# Patient Record
Sex: Female | Born: 1995 | Race: Black or African American | Hispanic: No | Marital: Single | Smoking: Never smoker
Health system: Southern US, Community
[De-identification: ages and names within clinical notes are randomized; demographics above are authoritative.]

## PROBLEM LIST (undated history)

## (undated) DIAGNOSIS — I2699 Other pulmonary embolism without acute cor pulmonale: Secondary | ICD-10-CM

## (undated) DIAGNOSIS — E119 Type 2 diabetes mellitus without complications: Secondary | ICD-10-CM

## (undated) DIAGNOSIS — F419 Anxiety disorder, unspecified: Secondary | ICD-10-CM

## (undated) DIAGNOSIS — G43909 Migraine, unspecified, not intractable, without status migrainosus: Secondary | ICD-10-CM

---
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
## 2017-11-18 ENCOUNTER — Other Ambulatory Visit: Payer: Self-pay

## 2017-11-18 ENCOUNTER — Emergency Department (HOSPITAL_BASED_OUTPATIENT_CLINIC_OR_DEPARTMENT_OTHER)
Admission: EM | Admit: 2017-11-18 | Discharge: 2017-11-18 | Disposition: A | Discharge status: Home / Self Care | Payer: PRIVATE HEALTH INSURANCE | Attending: Emergency Medicine | Admitting: Emergency Medicine

## 2017-11-18 ENCOUNTER — Emergency Department (HOSPITAL_BASED_OUTPATIENT_CLINIC_OR_DEPARTMENT_OTHER): Payer: PRIVATE HEALTH INSURANCE

## 2017-11-18 ENCOUNTER — Encounter (HOSPITAL_BASED_OUTPATIENT_CLINIC_OR_DEPARTMENT_OTHER): Payer: Self-pay

## 2017-11-18 DIAGNOSIS — R7989 Other specified abnormal findings of blood chemistry: Secondary | ICD-10-CM | POA: Insufficient documentation

## 2017-11-18 DIAGNOSIS — R0789 Other chest pain: Secondary | ICD-10-CM

## 2017-11-18 DIAGNOSIS — Z79899 Other long term (current) drug therapy: Secondary | ICD-10-CM | POA: Diagnosis not present

## 2017-11-18 DIAGNOSIS — R799 Abnormal finding of blood chemistry, unspecified: Secondary | ICD-10-CM | POA: Diagnosis present

## 2017-11-18 DIAGNOSIS — J45909 Unspecified asthma, uncomplicated: Secondary | ICD-10-CM | POA: Insufficient documentation

## 2017-11-18 LAB — CBC WITH DIFFERENTIAL/PLATELET
Abs Immature Granulocytes: 0.03 10*3/uL (ref 0.00–0.07)
Basophils Absolute: 0 10*3/uL (ref 0.0–0.1)
Basophils Relative: 0 %
EOS PCT: 2 %
Eosinophils Absolute: 0.2 10*3/uL (ref 0.0–0.5)
HEMATOCRIT: 37.9 % (ref 36.0–46.0)
HEMOGLOBIN: 11.6 g/dL — AB (ref 12.0–15.0)
Immature Granulocytes: 0 %
Lymphocytes Relative: 41 %
Lymphs Abs: 3.9 10*3/uL (ref 0.7–4.0)
MCH: 24.7 pg — AB (ref 26.0–34.0)
MCHC: 30.6 g/dL (ref 30.0–36.0)
MCV: 80.6 fL (ref 80.0–100.0)
MONO ABS: 0.6 10*3/uL (ref 0.1–1.0)
Monocytes Relative: 7 %
Neutro Abs: 4.6 10*3/uL (ref 1.7–7.7)
Neutrophils Relative %: 50 %
Platelets: 400 10*3/uL (ref 150–400)
RBC: 4.7 MIL/uL (ref 3.87–5.11)
RDW: 16.1 % — ABNORMAL HIGH (ref 11.5–15.5)
WBC: 9.4 10*3/uL (ref 4.0–10.5)
nRBC: 0 % (ref 0.0–0.2)

## 2017-11-18 LAB — BASIC METABOLIC PANEL
Anion gap: 8 (ref 5–15)
BUN: 15 mg/dL (ref 6–20)
CHLORIDE: 104 mmol/L (ref 98–111)
CO2: 26 mmol/L (ref 22–32)
Calcium: 9.6 mg/dL (ref 8.9–10.3)
Creatinine, Ser: 0.66 mg/dL (ref 0.44–1.00)
GFR calc Af Amer: >60 mL/min (ref >60)
GFR calc non Af Amer: >60 mL/min (ref >60)
GLUCOSE: 103 mg/dL — AB (ref 70–99)
Potassium: 3.8 mmol/L (ref 3.5–5.1)
Sodium: 138 mmol/L (ref 135–145)

## 2017-11-18 LAB — TROPONIN I: Troponin I: <0.03 ng/mL (ref <0.03)

## 2017-11-18 LAB — HCG, QUANTITATIVE, PREGNANCY: hCG, Beta Chain, Quant, S: <1 m[IU]/mL (ref <5)

## 2017-11-18 MED ORDER — IOPAMIDOL (ISOVUE-370) INJECTION 76%
100.0000 mL | Freq: Once | INTRAVENOUS | Status: AC | PRN
  Administered 2017-11-18: 100 mL via INTRAVENOUS

## 2017-11-18 MED ORDER — FAMOTIDINE 20 MG PO TABS: 20.0000 mg | ORAL_TABLET | Freq: Every day | ORAL | 0 refills | Status: AC

## 2017-11-18 NOTE — ED Triage Notes (Signed)
Pt c/o left side chest pain for the last month with subjective SOB, went to UC and was sent here for an elevated d-dimer, no birth control, non-smoker, no recent long trips

## 2017-11-18 NOTE — ED Notes (Signed)
Presents with chest pain and shortness of breath, chest pain at left of mid sternum non-radiating, last approx . Normally occurs at end of work day, also occurred after being in the shower. Does not occur during sleep or at rest times.

## 2017-11-18 NOTE — ED Notes (Signed)
Skin warm and dry. Alert and oriented x 4.

## 2017-11-18 NOTE — Discharge Instructions (Signed)
You may trial Pepcid daily for the next 2 weeks.  If this helps, follow-up with your primary care doctor for further evaluation and refill. If symptoms do not improve, follow-up with your primary care doctor for further investigation of your symptoms. Return to the emergency room with any new, worsening, concerning symptoms.

## 2017-11-18 NOTE — ED Notes (Addendum)
NAD at this time. Pt is stable and going home.  

## 2017-11-18 NOTE — ED Notes (Signed)
PA student in to evaluate patient, pt appears comfortable, alert and oriented, family member with pt

## 2017-11-18 NOTE — ED Provider Notes (Signed)
[facility] EMERGENCY DEPARTMENT Provider Note   CSN: [id number] Arrival date & time: 11/18/17  1551     History   Chief Complaint Chief Complaint  Patient presents with  . Abnormal Lab    HPI Susan Newton is a 22 y.o. female presenting for evaluation of chest pain, shortness of breath, and elevated d-dimer.  Patient states over the past month, she has been having central/left-sided chest pain.  She reports pain is worse with exertion.  She has shortness of breath with exertion.  Patient states when she feels her symptoms, begins with nausea.  Then she developed chest pain and shortness of breath.  Then she develops a headache.  The symptoms last for 5 to 10 minutes before resolving without intervention.  She was evaluated at urgent care for this today, was found to have an elevated d-dimer and told to come to the emergency room for further evaluation. Patient states she does start a new job.  Additionally, she has family stressors and has just started being seen for weight management. She reports a history of borderline hypertension which is being monitored but not medicated.  She has a history of asthma for which she takes medication.  She denies all other medical problems including diabetes.  She denies tobacco, alcohol, or drug use. Patient denies recent fevers, chills, sore throat, cough, abdominal pain, urinary symptoms, abnormal bowel movements. she denies leg pain or swelling Patient states she recently drove to and from [place] 2 weeks ago.  This was after her symptoms began.  Otherwise, she denies trauma, immobilization, surgeries, OCP use, history of cancer, or history of previous DVT/PE. She has tried Tylenol and ibuprofen without improvement of her symptoms.  She has not tried anything else.  She is currently symptom-free.  Additional history obtained from chart review, patient has been evaluated multiple times in the past for chest pain.  Has had reassuring  work-ups in the past. ddimer mildly elevated today at 560   HPI  Past Medical History:  Diagnosis Date  . Asthma   . Migraine     Patient Active Problem List   Diagnosis Date Noted  . Morbid obesity (HCC) 10/06/2012  . Abnormal body odor 10/06/2012  . Dental caries 10/06/2012  . Acanthosis nigricans 10/06/2012  . 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 Migraines 10/06/2012  . Stress and adjustment reaction (school, home) 10/06/2012    History reviewed. No pertinent surgical history.   OB History   None      Home Medications    Prior to Admission medications   Medication Sig Start Date End Date Taking? Authorizing Provider  albuterol (PROVENTIL HFA;VENTOLIN HFA) 108 (90 BASE) MCG/ACT inhaler Inhale 2 puffs into the lungs every 6 (six) hours as needed for shortness of breath. For shortness of breath    [provider]  famotidine (PEPCID) 20 MG tablet Take 1 tablet (20 mg total) by mouth daily. 11/18/17   Ajee Heasley, PA-C  ibuprofen (ADVIL,MOTRIN) 800 MG tablet Take 800 mg by mouth every 8 (eight) hours as needed for pain.    [provider]  topiramate (TOPAMAX) 25 MG tablet As previously prescribed 03/11/12   Joelyn Oms, MD  topiramate (TOPAMAX) 25 MG tablet Take 25 mg by mouth 2 (two) times daily.    [provider]    Family History No family history on file.  Social History Social History   Tobacco Use  . Smoking status: Never Smoker  . Smokeless tobacco: Never Used  Substance Use Topics  . Alcohol use: No  . Drug use: Not on file     Allergies   Bee venom; Mushroom extract complex; and Penicillins   Review of Systems Review of Systems  Respiratory: Positive for shortness of breath.   Cardiovascular: Positive for chest pain (none currently).  Gastrointestinal: Positive for nausea.  Neurological: Positive for headaches (none currently).  All other systems reviewed and are negative.    Physical Exam Updated Vital  Signs BP 120/83 (BP Location: Right Wrist)   Pulse 81   Temp 98.2 F (36.8 C) (Oral)   Resp 20   Ht 5\' 4"  (1.626 m)   Wt (!) 167.8 kg   LMP 11/09/2017   SpO2 100%   BMI 63.51 kg/m   Physical Exam  Constitutional: She is oriented to person, place, and time. She appears well-developed and well-nourished. No distress.  Obese female in no acute distress  HENT:  Head: Normocephalic and atraumatic.  Eyes: Pupils are equal, round, and reactive to light. Conjunctivae and EOM are normal.  Neck: Normal range of motion. Neck supple.  Cardiovascular: Normal rate, regular rhythm and intact distal pulses.  Pulmonary/Chest: Effort normal and breath sounds normal. No respiratory distress. She has no wheezes.  Speaking in full sentences.  Clear lung sounds in all fields.  No respiratory distress.  Abdominal: Soft. She exhibits no distension and no mass. There is no tenderness. There is no guarding.  Musculoskeletal: Normal range of motion.  No leg pain or swelling  Neurological: She is alert and oriented to person, place, and time.  Skin: Skin is warm and dry. Capillary refill takes less than 2 seconds.  Psychiatric: She has a normal mood and affect.  Nursing note and vitals reviewed.    ED Treatments / Results  Labs (all labs ordered are listed, but only abnormal results are displayed) Labs Reviewed  CBC WITH DIFFERENTIAL/PLATELET - Abnormal; Notable for the following components:      Result Value   Hemoglobin 11.6 (*)    MCH 24.7 (*)    RDW 16.1 (*)    All other components within normal limits  BASIC METABOLIC PANEL - Abnormal; Notable for the following components:   Glucose, Bld 103 (*)    All other components within normal limits  TROPONIN I  HCG, QUANTITATIVE, PREGNANCY    EKG EKG Interpretation  Date/Time:  Tuesday November 18 2017 16:35:57 EST Ventricular Rate:  95 PR Interval:    QRS Duration: 87 QT Interval:  360 QTC Calculation: 453 R Axis:   60 Text  Interpretation:  Sinus arrhythmia No significant change since last tracing Confirmed by Melene Plan [phone]) on 11/18/2017 5:49:26 PM   Radiology Ct Angio Chest Pe W And/or Wo Contrast  Result Date: 11/18/2017 CLINICAL DATA:  Intermittent chest pain and nausea x1 month. History of asthma and vaping. EXAM: CT ANGIOGRAPHY CHEST WITH CONTRAST TECHNIQUE: Multidetector CT imaging of the chest was performed using the standard protocol during bolus administration of intravenous contrast. Multiplanar CT image reconstructions and MIPs were obtained to evaluate the vascular anatomy. CONTRAST:  Sixty-nine ISOVUE-370 IOPAMIDOL (ISOVUE-370) INJECTION 76% COMPARISON:  CXR 11/18/2017 FINDINGS: Cardiovascular: The study is of limited for the evaluation of pulmonary embolism due to body habitus and timing of contrast bolus. Preferential filling of the thoracic aorta is identified at the time of imaging. No large central pulmonary embolus is identified. Great vessels are normal in course and caliber. Normal heart size. No significant pericardial fluid/thickening. Minimal scattered left main  and three-vessel coronary arteriosclerosis. Mediastinum/Nodes: No discrete thyroid nodules. Unremarkable esophagus. No pathologically enlarged axillary, mediastinal or hilar lymph nodes. Lungs/Pleura: No pneumothorax. No pleural effusion. No acute pulmonary consolidation or dominant mass. Upper abdomen: Unremarkable. Musculoskeletal:  No aggressive appearing focal osseous lesions. Review of the MIP images confirms the above findings. IMPRESSION: 1. The study is limited for the assessment of pulmonary emboli beyond the left and right main pulmonary arteries due to patient body habitus and timing of contrast bolus. No large central pulmonary embolus is noted. 2. No active pulmonary disease. 3. Minimal left main and three-vessel coronary arteriosclerosis. Electronically Signed   By: Tollie Eth M.D.   On: 11/18/2017 18:29     Procedures Procedures (including critical care time)  Medications Ordered in ED Medications  iopamidol (ISOVUE-370) 76 % injection 100 mL (100 mLs Intravenous Contrast Given 11/18/17 1805)     Initial Impression / Assessment and Plan / ED Course  I have reviewed the triage vital signs and the nursing notes.  Pertinent labs & imaging results that were available during my care of the patient were reviewed by me and considered in my medical decision making (see chart for details).     For evaluation of chest pain, shortness of breath, and elevated d-dimer.  On discussion with the patient, symptoms appear to be group, started as nausea, developed some to chest pain and then a headache.  Symptoms resolved after 5 to 10 minutes.  Patient reports increased stress both at work and home.  Lower suspicion for ACS or PE, but considering elevated d-dimer, will offer CTA. Will obtain basic labs, troponin and EKG  Labs reassuring, no leukocytosis.  Troponin negative.  Liver and kidney function stable.  Hemoglobin stable.  CTA pending.  EKG without STEMI, unchanged from previous.  CTA negative for PE. No acute findings. discussed with pt and mom. Pt unsure if she has been having reflux sxs. As pt has associated nausea before cp and sob, will trial pepcid. Pt to f/u with pcp. At this time, pt appears safe for d/c. Return precautions given. Pt states she understands and agree to plan.   Final Clinical Impressions(s) / ED Diagnoses   Final diagnoses:  Atypical chest pain  Elevated d-dimer    ED Discharge Orders         Ordered    famotidine (PEPCID) 20 MG tablet  Daily     11/18/17 1839           Alveria Apley, PA-C 11/18/17 1848    Melene Plan, DO 11/18/17 2259

## 2017-11-18 NOTE — ED Notes (Signed)
ED Provider at bedside. 

## 2017-12-10 IMAGING — CR DG CHEST 2V
2 series · 2 of 2 positions shown · non-contrast
Comparison: 03/30/2011

CLINICAL DATA: Chest pain

EXAM:
CHEST  2 VIEW

[w chest pa *]
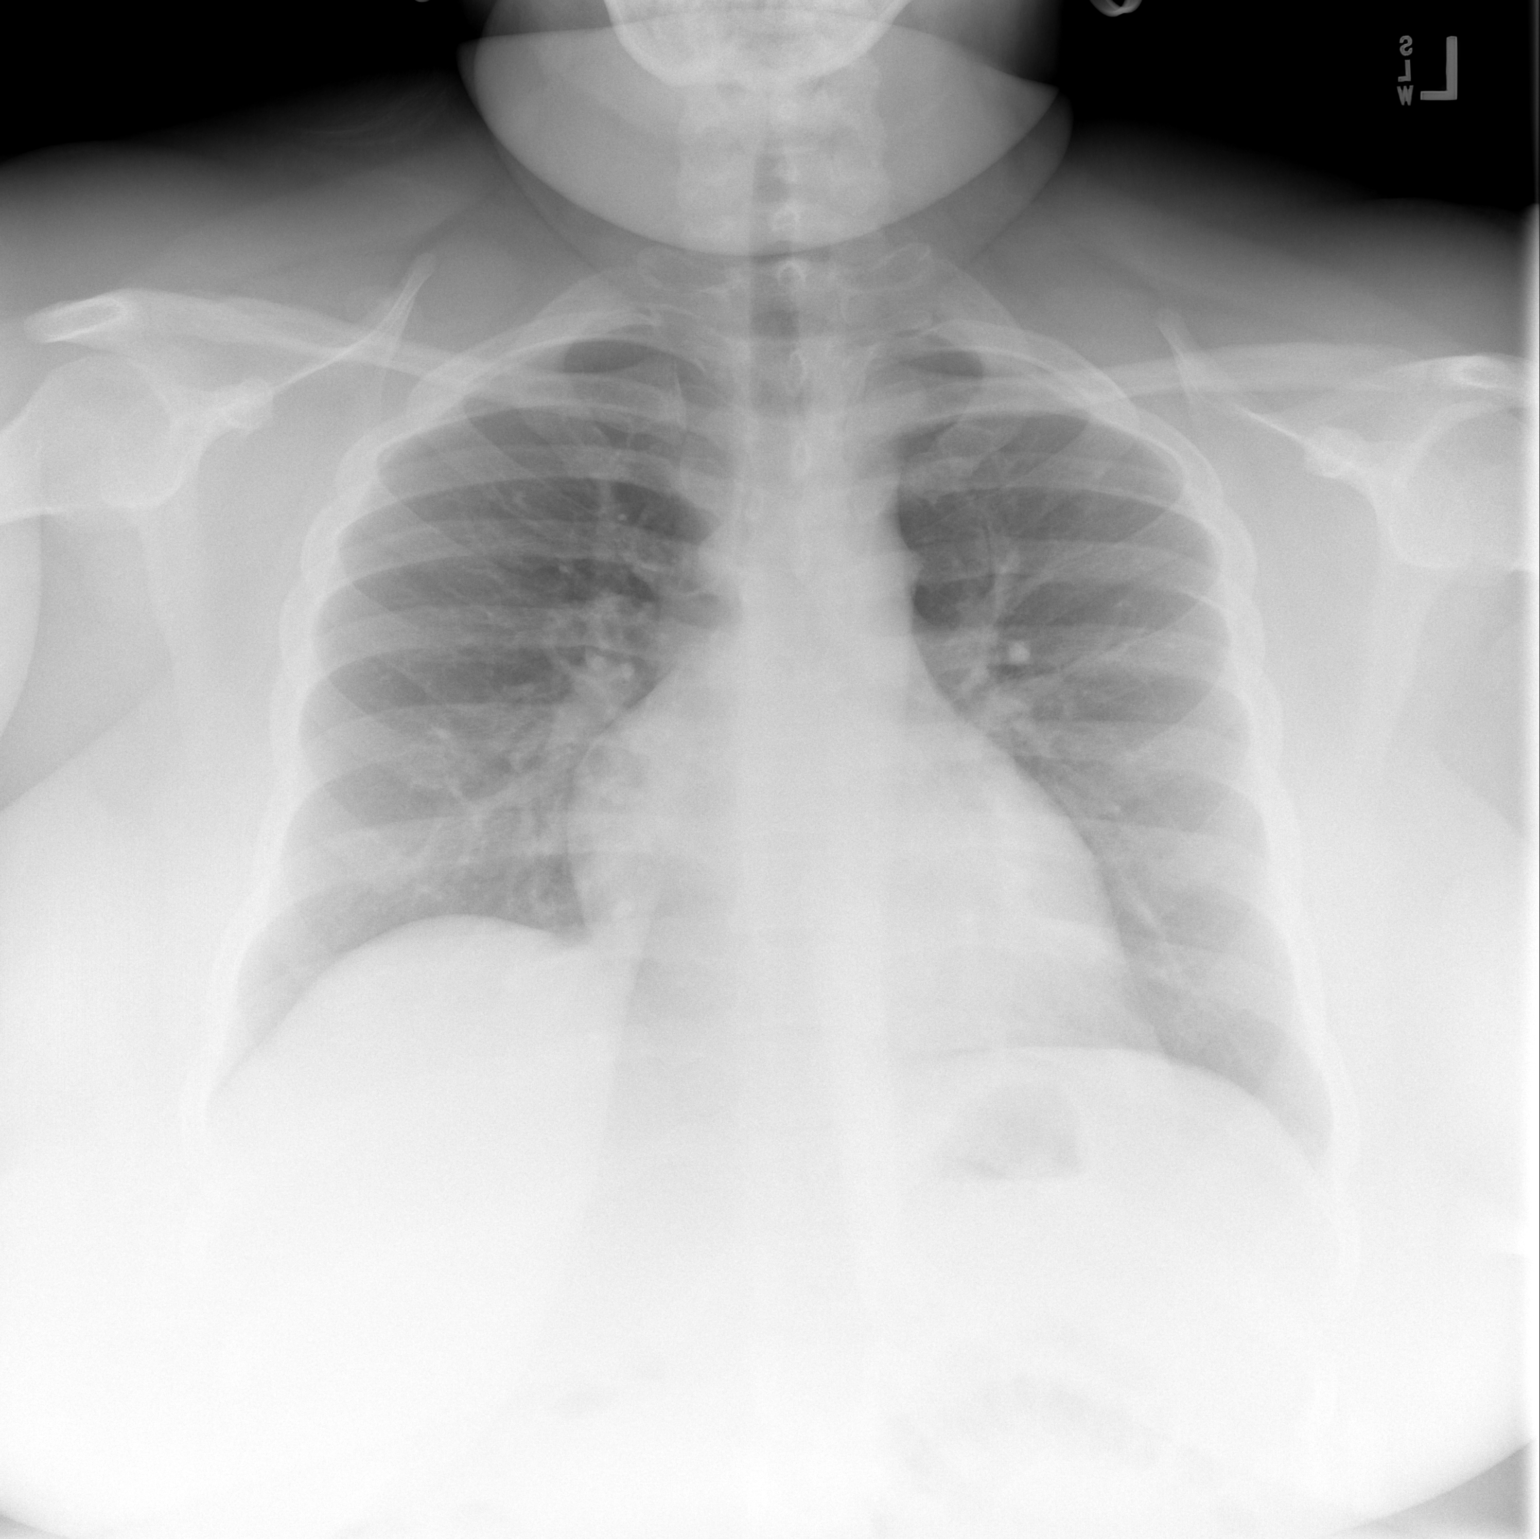

[w chest lat *]
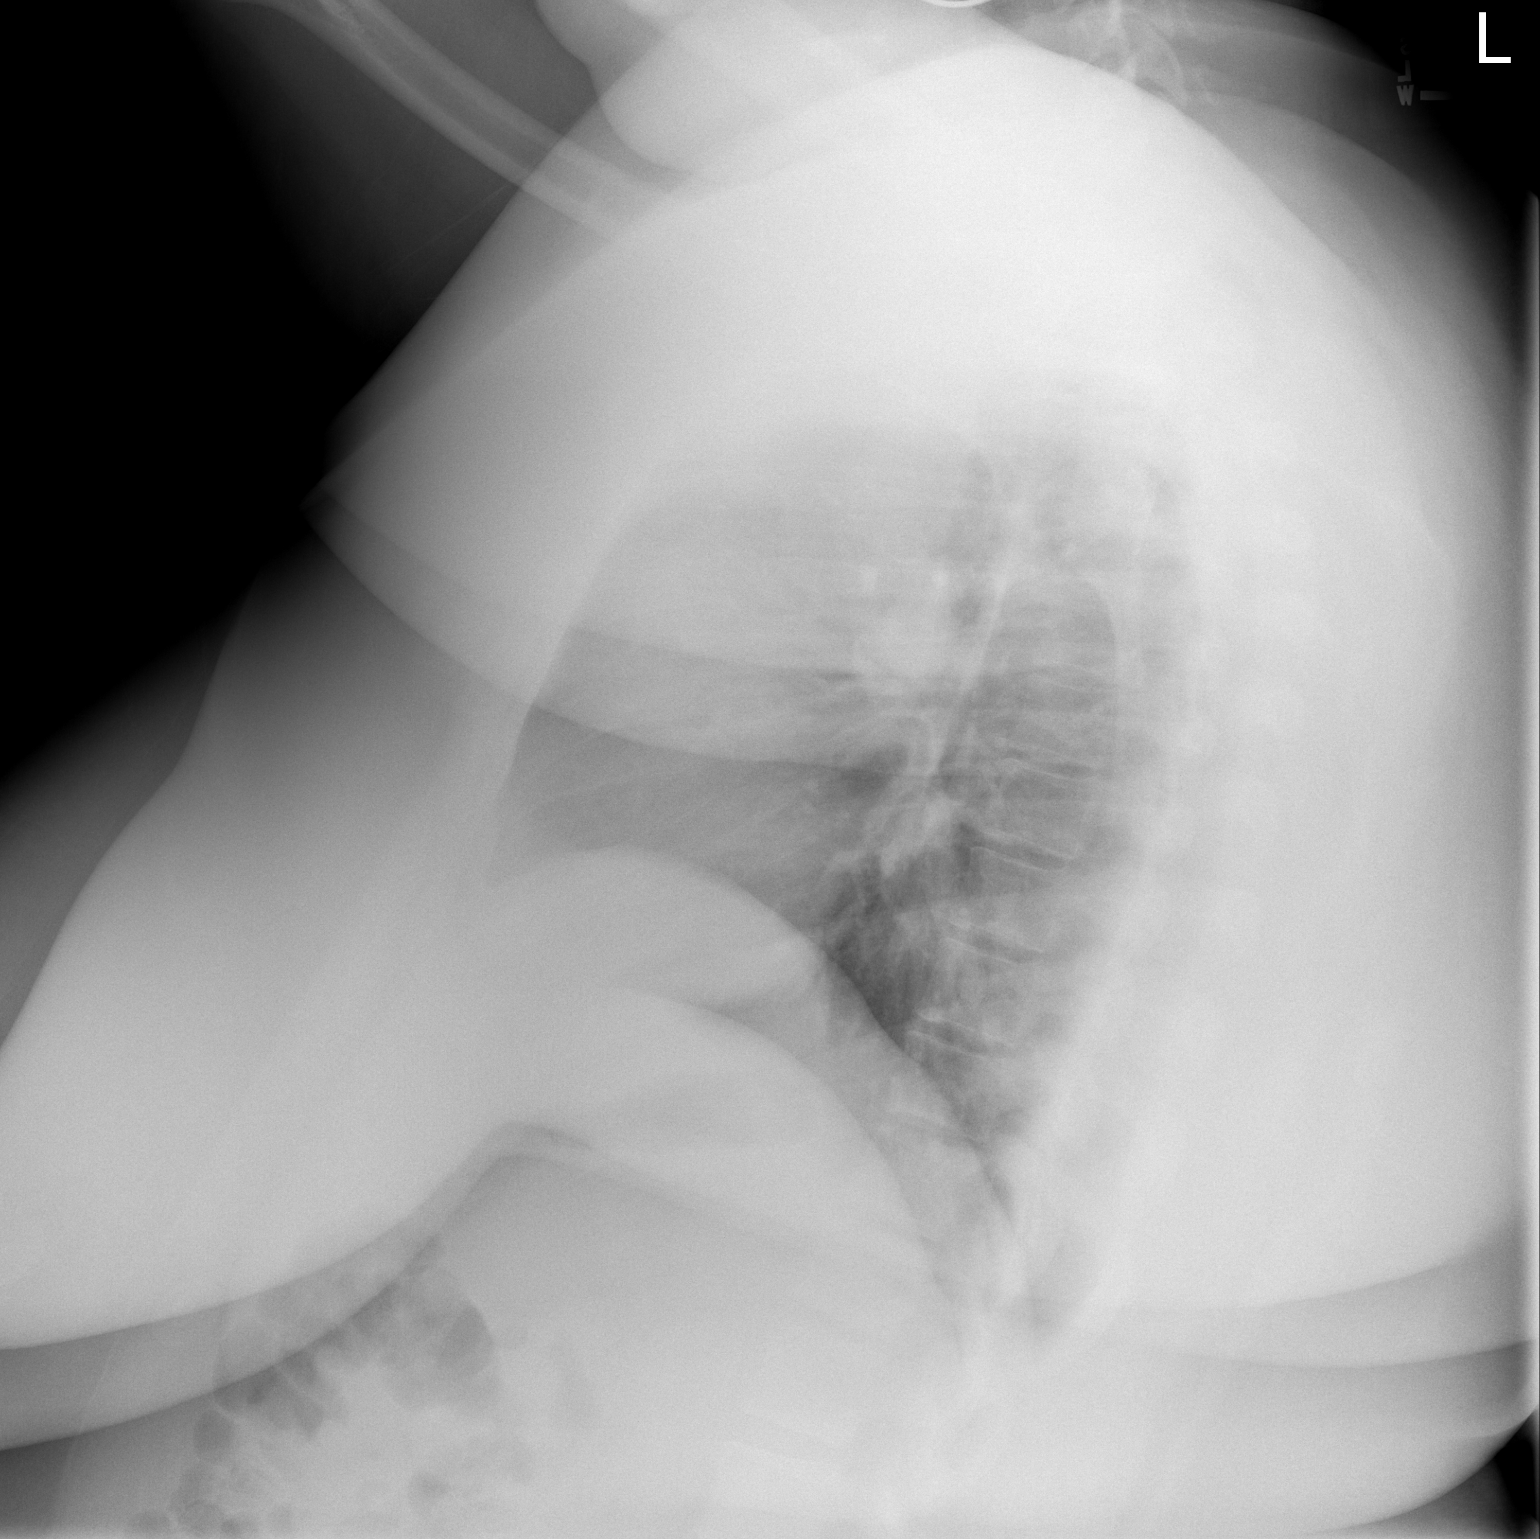

[2 of 2 positions shown; findings below may reference images not displayed]

FINDINGS: Normal heart size and aortic contours. Stable appearance of the
hila. There is no edema, consolidation, effusion, or pneumothorax.
No osseous findings.
IMPRESSION: Negative chest.

## 2019-04-15 ENCOUNTER — Other Ambulatory Visit: Payer: Self-pay

## 2019-04-15 ENCOUNTER — Emergency Department (HOSPITAL_BASED_OUTPATIENT_CLINIC_OR_DEPARTMENT_OTHER)
Admission: EM | Admit: 2019-04-15 | Discharge: 2019-04-15 | Disposition: A | Discharge status: Home / Self Care | Payer: PRIVATE HEALTH INSURANCE | Attending: Emergency Medicine | Admitting: Emergency Medicine

## 2019-04-15 ENCOUNTER — Encounter (HOSPITAL_BASED_OUTPATIENT_CLINIC_OR_DEPARTMENT_OTHER): Payer: Self-pay

## 2019-04-15 ENCOUNTER — Emergency Department (HOSPITAL_BASED_OUTPATIENT_CLINIC_OR_DEPARTMENT_OTHER): Payer: PRIVATE HEALTH INSURANCE

## 2019-04-15 DIAGNOSIS — Z880 Allergy status to penicillin: Secondary | ICD-10-CM | POA: Insufficient documentation

## 2019-04-15 DIAGNOSIS — Z91030 Bee allergy status: Secondary | ICD-10-CM | POA: Insufficient documentation

## 2019-04-15 DIAGNOSIS — R0789 Other chest pain: Secondary | ICD-10-CM | POA: Insufficient documentation

## 2019-04-15 DIAGNOSIS — Z91018 Allergy to other foods: Secondary | ICD-10-CM | POA: Insufficient documentation

## 2019-04-15 DIAGNOSIS — Z79899 Other long term (current) drug therapy: Secondary | ICD-10-CM | POA: Insufficient documentation

## 2019-04-15 DIAGNOSIS — J45909 Unspecified asthma, uncomplicated: Secondary | ICD-10-CM | POA: Diagnosis not present

## 2019-04-15 DIAGNOSIS — R079 Chest pain, unspecified: Secondary | ICD-10-CM

## 2019-04-15 HISTORY — DX: Other pulmonary embolism without acute cor pulmonale: I26.99

## 2019-04-15 LAB — CBC
HCT: 36.1 % (ref 36.0–46.0)
Hemoglobin: 10.6 g/dL — ABNORMAL LOW (ref 12.0–15.0)
MCH: 22 pg — ABNORMAL LOW (ref 26.0–34.0)
MCHC: 29.4 g/dL — ABNORMAL LOW (ref 30.0–36.0)
MCV: 75.1 fL — ABNORMAL LOW (ref 80.0–100.0)
Platelets: 488 10*3/uL — ABNORMAL HIGH (ref 150–400)
RBC: 4.81 MIL/uL (ref 3.87–5.11)
RDW: 19.8 % — ABNORMAL HIGH (ref 11.5–15.5)
WBC: 8.2 10*3/uL (ref 4.0–10.5)
nRBC: 0 % (ref 0.0–0.2)

## 2019-04-15 LAB — BASIC METABOLIC PANEL
Anion gap: 9 (ref 5–15)
BUN: 11 mg/dL (ref 6–20)
CO2: 26 mmol/L (ref 22–32)
Calcium: 9.1 mg/dL (ref 8.9–10.3)
Chloride: 102 mmol/L (ref 98–111)
Creatinine, Ser: 0.62 mg/dL (ref 0.44–1.00)
GFR calc Af Amer: >60 mL/min (ref >60)
GFR calc non Af Amer: >60 mL/min (ref >60)
Glucose, Bld: 106 mg/dL — ABNORMAL HIGH (ref 70–99)
Potassium: 4 mmol/L (ref 3.5–5.1)
Sodium: 137 mmol/L (ref 135–145)

## 2019-04-15 LAB — PREGNANCY, URINE: Preg Test, Ur: NEGATIVE

## 2019-04-15 LAB — TROPONIN I (HIGH SENSITIVITY)
Troponin I (High Sensitivity): 3 ng/L (ref <18)
Troponin I (High Sensitivity): 2 ng/L (ref <18)

## 2019-04-15 MED ORDER — ALUM & MAG HYDROXIDE-SIMETH 200-200-20 MG/5ML PO SUSP
30.0000 mL | Freq: Once | ORAL | Status: AC
  Administered 2019-04-15: 16:00:00 30 mL via ORAL
  Filled 2019-04-15: qty 30

## 2019-04-15 MED ORDER — HYDROXYZINE HCL 25 MG PO TABS
25.0000 mg | ORAL_TABLET | Freq: Once | ORAL | Status: AC
  Administered 2019-04-15: 25 mg via ORAL
  Filled 2019-04-15: qty 1

## 2019-04-15 NOTE — Discharge Instructions (Addendum)
Please follow-up with your primary care provider regarding today's encounter.  I highly recommend that you speak with your PCP about your anxiety.  I encourage you to discuss incorporating an SSRI/SNRI and/or therapy.  You also will need to have your sleep study completed.  You would likely benefit from CPAP.  Take Maalox or other abortive antacids for symptoms of GERD.  If you experience episodes of GERD more than twice a week you should discuss with your PCP proton pump inhibitors or H2 blockers.  Please return to the ED or seek immediate medical attention should you experience any new or worsening symptoms.

## 2019-04-15 NOTE — ED Notes (Signed)
Pt reports increase in stress and anxiety over the past few days. States chest pain is worse when she feels anxious.

## 2019-04-15 NOTE — ED Triage Notes (Addendum)
Pt c/o CP started yesterday-states she had hx of "blood clot" ~1-1.5 yrs ago that she thinks was in her lung but unsure left or right-denies fever/cough/flu sx-NAD-to triage in w/c-able to stand for weight w/o difficulty

## 2019-04-15 NOTE — ED Provider Notes (Signed)
[facility] EMERGENCY DEPARTMENT Provider Note   CSN: [id number] Arrival date & time: 04/15/19  1402     History Chief Complaint  Patient presents with  . Chest Pain    Susan Newton is a 24 y.o. female with PMH significant for anxiety, obesity, and OSA who presents to the ED with complaints of chest pain.  Patient reports that she stopped at her boyfriend's house last night and was not able to obtain good sleep as she was lying completely flat.  She endorses orthopnea and significant snoring so she sleeps with an incline at her home.  This morning, she felt as though she was mildly short of breath with some burning substernal chest discomfort.  She then proceeded to get into an argument with her boyfriend and shortly after leaving and getting to her personal vehicle, she developed significant chest squeezing discomfort, shortness of breath symptoms, and feeling as though she may pass out.  She called her father and then felt as though things "went black".  She woke up to her father, Susan Newton, performing a sternal rub.  Susan Newton informs me that she often will experience episodes of chest discomfort and that there is usually an anxiety component, however he was concerned given her being difficult to arouse for an extended period of time.  Patient denies any recent illness, fevers or chills, headache or dizziness, cough, back or abdominal pain, nausea or vomiting, leg swelling, clotting disorder, recent surgery or immobilization, chance of pregnancy, urinary symptoms, or changes in bowel habits.  She had been prescribed an SNRI by her PCP for her symptoms of anxiety, but and has been out of it for awhile.    HPI     Past Medical History:  Diagnosis Date  . Asthma   . Migraine   . Pulmonary emboli [facility])     Patient Active Problem List   Diagnosis Date Noted  . Morbid obesity (HCC) 10/06/2012  . Abnormal body odor 10/06/2012  . Dental caries 10/06/2012  . Acanthosis nigricans  10/06/2012  . 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 Migraines 10/06/2012  . Stress and adjustment reaction (school, home) 10/06/2012    History reviewed. No pertinent surgical history.   OB History   No obstetric history on file.     No family history on file.  Social History   Tobacco Use  . Smoking status: Never Smoker  . Smokeless tobacco: Never Used  Substance Use Topics  . Alcohol use: Yes    Comment: occ  . Drug use: Never    Home Medications Prior to Admission medications   Medication Sig Start Date End Date Taking? Authorizing Provider  albuterol (PROVENTIL HFA;VENTOLIN HFA) 108 (90 BASE) MCG/ACT inhaler Inhale 2 puffs into the lungs every 6 (six) hours as needed for shortness of breath. For shortness of breath    [provider]  famotidine (PEPCID) 20 MG tablet Take 1 tablet (20 mg total) by mouth daily. 11/18/17   Caccavale, Sophia, PA-C  ibuprofen (ADVIL,MOTRIN) 800 MG tablet Take 800 mg by mouth every 8 (eight) hours as needed for pain.    [provider]  topiramate (TOPAMAX) 25 MG tablet As previously prescribed 03/11/12   Joelyn Oms, MD  topiramate (TOPAMAX) 25 MG tablet Take 25 mg by mouth 2 (two) times daily.    [provider]    Allergies    Bee venom, Mushroom extract complex, and Penicillins  Review of Systems   Review of Systems  Constitutional: Negative for  fever.  Respiratory: Positive for shortness of breath. Negative for cough and wheezing.   Cardiovascular: Positive for chest pain.  Gastrointestinal: Negative for abdominal pain.  Musculoskeletal: Negative for back pain.  Psychiatric/Behavioral: The patient is nervous/anxious.     Physical Exam Updated Vital Signs BP (!) 149/85   Pulse (!) 103   Temp 98.3 F (36.8 C) (Oral)   Resp (!) 22   Ht 5\' 4"  (1.626 m)   Wt (!) 180.5 kg   LMP 04/07/2019   SpO2 99%   BMI 68.32 kg/m   Physical Exam Vitals and nursing note reviewed. Exam conducted with a chaperone  present.  Constitutional:      Appearance: She is obese.     Comments: Teary-eyed.  Anxious and upset.  HENT:     Head: Normocephalic and atraumatic.  Eyes:     General: No scleral icterus.    Conjunctiva/sclera: Conjunctivae normal.  Cardiovascular:     Rate and Rhythm: Normal rate and regular rhythm.     Pulses: Normal pulses.     Heart sounds: Normal heart sounds.  Pulmonary:     Effort: Pulmonary effort is normal. No respiratory distress.     Breath sounds: Normal breath sounds. No wheezing or rales.  Musculoskeletal:        General: Normal range of motion.     Cervical back: Normal range of motion.     Right lower leg: No edema.     Left lower leg: No edema.  Skin:    General: Skin is dry.     Capillary Refill: Capillary refill takes less than 2 seconds.  Neurological:     Mental Status: She is alert and oriented to person, place, and time.     GCS: GCS eye subscore is 4. GCS verbal subscore is 5. GCS motor subscore is 6.  Psychiatric:        Mood and Affect: Mood normal.        Behavior: Behavior normal.        Thought Content: Thought content normal.      ED Results / Procedures / Treatments   Labs (all labs ordered are listed, but only abnormal results are displayed) Labs Reviewed  BASIC METABOLIC PANEL - Abnormal; Notable for the following components:      Result Value   Glucose, Bld 106 (*)    All other components within normal limits  CBC - Abnormal; Notable for the following components:   Hemoglobin 10.6 (*)    MCV 75.1 (*)    MCH 22.0 (*)    MCHC 29.4 (*)    RDW 19.8 (*)    Platelets 488 (*)    All other components within normal limits  PREGNANCY, URINE  TROPONIN I (HIGH SENSITIVITY)  TROPONIN I (HIGH SENSITIVITY)    EKG EKG Interpretation  Date/Time:  Thursday April 15 2019 14:07:13 EDT Ventricular Rate:  112 PR Interval:  126 QRS Duration: 80 QT Interval:  352 QTC Calculation: 480 R Axis:   36 Text Interpretation: Sinus tachycardia  Nonspecific T wave abnormality No significant change since last tracing Confirmed by [id number] (Gwyneth Sprout) on 04/15/2019 2:40:37 PM   Radiology DG Chest 2 View  Result Date: 04/15/2019 CLINICAL DATA:  24 year old female with chest pain. EXAM: CHEST - 2 VIEW COMPARISON:  Chest radiograph dated 11/18/2017. FINDINGS: The heart size and mediastinal contours are within normal limits. Both lungs are clear. The visualized skeletal structures are unremarkable. IMPRESSION: No active cardiopulmonary disease. Electronically Signed  By: Anner Crete M.D.   On: 04/15/2019 15:19    Procedures Procedures (including critical care time)  Medications Ordered in ED Medications  alum & mag hydroxide-simeth (MAALOX/MYLANTA) 200-200-20 MG/5ML suspension 30 mL (30 mLs Oral Given 04/15/19 1539)  hydrOXYzine (ATARAX/VISTARIL) tablet 25 mg (25 mg Oral Given 04/15/19 1539)    ED Course  I have reviewed the triage vital signs and the nursing notes.  Pertinent labs & imaging results that were available during my care of the patient were reviewed by me and considered in my medical decision making (see chart for details).    MDM Rules/Calculators/A&P                      Patient's history and physical exam is consistent with nonspecific chest pain.  I personally reviewed patient's medical record and the pulmonary embolism that she said in her past medical history was inaccurate.  The CTA obtained in 2019 did not demonstrate any evidence of PE.  She denies any other history of clots or clotting disorder.  While she is endorsing a sudden onset chest pain and was mildly tachycardic while in triage, I have low suspicion for pulmonary embolism.  Patient denies any hormone use, history of clots, findings, recent surgery or immobilization, or other risk factors.  If not for her tachycardia, she would be PERC negative.  She is accompanied by her father who denies any family history of premature cardiac disease.  However,  patient would certainly benefit from sleep study given her snoring and concern for OSA.  She has a BMI of 68.32 and has been evaluated for weight management.  Given that she was lying flat last night, there is a possibility that she was hypercarbic which contributed to her feeling fatigued and short of breath this morning.  However, on my examination she is not demonstrating any increased work of breathing.  She instead feels that she has to be evaluated here in the ER for her chest pain symptoms.  I assured her that it is always in her best interest to come here for evaluation to ensure that there is no acute or emergent cardiopulmonary disease, particularly given that she was difficult to arouse.  However, given her past medical history of anxiety in conjunction with her report that her symptoms worsened after having a verbal argument with her boyfriend, suspect that that is again playing at home and her presentation here to the ED today.  Her hemoglobin was mildly low at 10.6 compared to labs obtained 1 year ago 11.6, but I have low suspicion for demand ischemia causing her chest pain today.  The remainder of her laboratory work-up is entirely unremarkable.  Plain films obtained demonstrate no acute cardiopulmonary findings.  I discussed with Dr. Maryan Rued who agrees with assessment and plan.   On reexamination, patient is feeling improved.  Strict ED return precautions discussed.  All of the evaluation and work-up results were discussed with the patient and any family at bedside. They were provided opportunity to ask any additional questions and have none at this time. They have expressed understanding of verbal discharge instructions as well as return precautions and are agreeable to the plan.    Final Clinical Impression(s) / ED Diagnoses Final diagnoses:  Nonspecific chest pain    Rx / DC Orders ED Discharge Orders    None       Corena Herter, PA-C 04/15/19 1808    Blanchie Dessert,  MD 04/16/19 [phone]

## 2021-08-16 IMAGING — DX DG CHEST 2V
2 series · 2 of 2 positions shown · non-contrast
Comparison: Chest radiograph dated 11/18/2017.

CLINICAL DATA: 24-year-old female with chest pain.

EXAM:
CHEST - 2 VIEW

[chest pa]
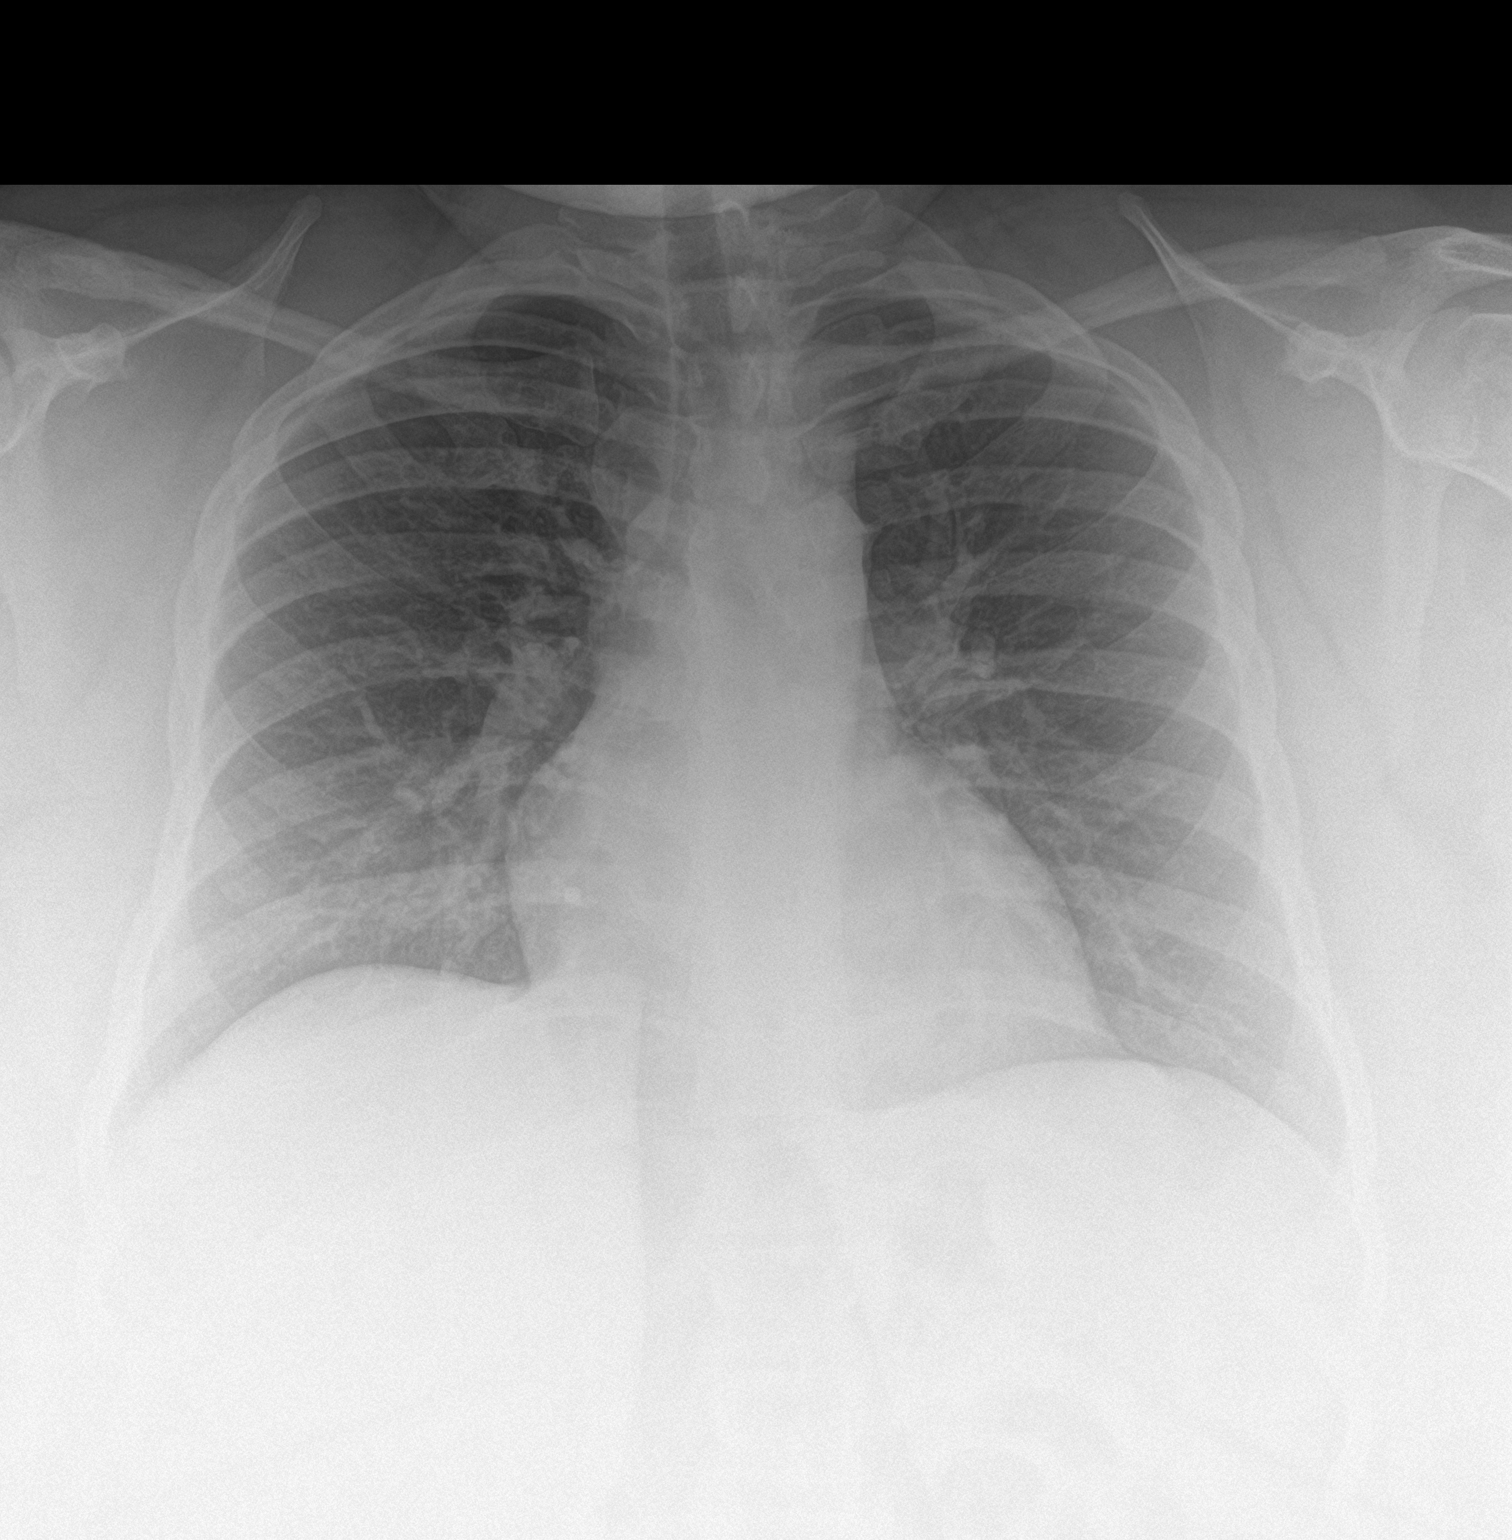

[chest lat]
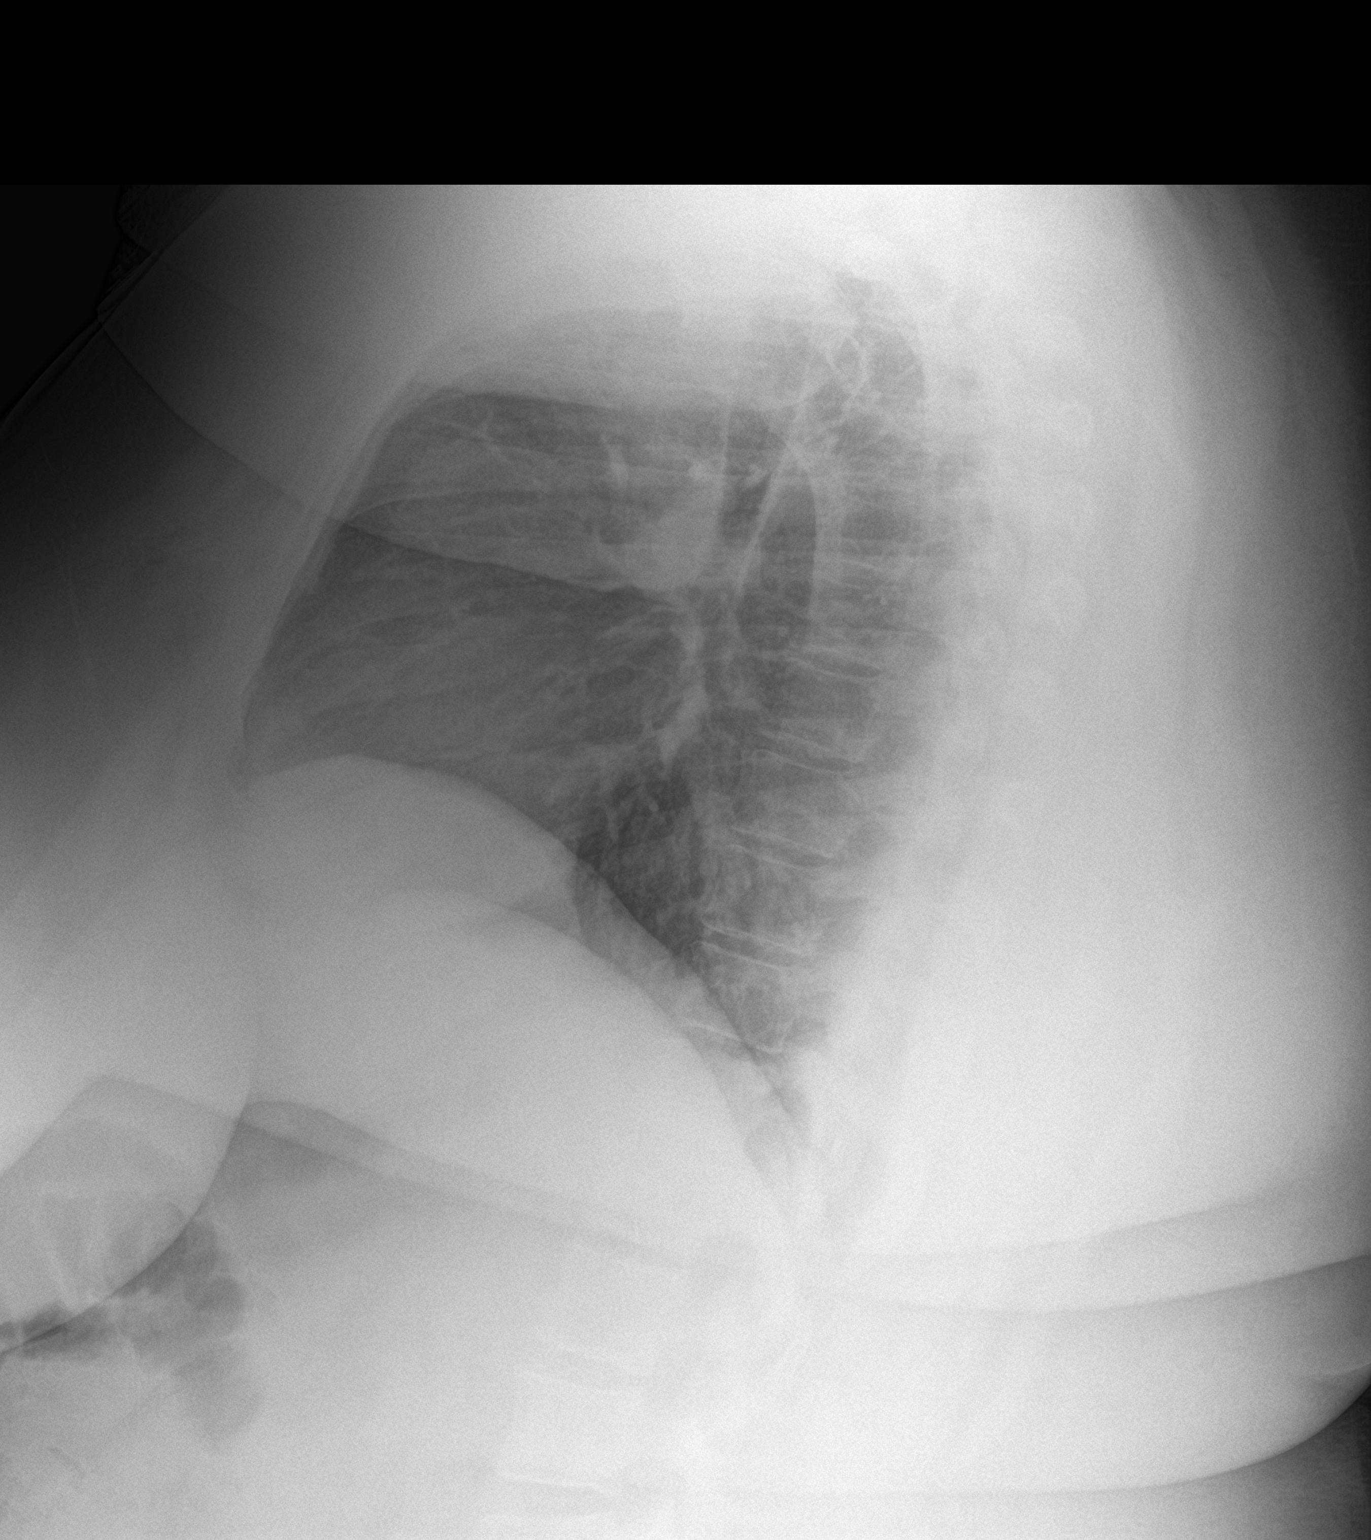

[2 of 2 positions shown; findings below may reference images not displayed]

FINDINGS: The heart size and mediastinal contours are within normal limits.
Both lungs are clear. The visualized skeletal structures are
unremarkable.
IMPRESSION: No active cardiopulmonary disease.

## 2023-04-29 ENCOUNTER — Encounter (HOSPITAL_BASED_OUTPATIENT_CLINIC_OR_DEPARTMENT_OTHER): Payer: Self-pay

## 2023-04-29 ENCOUNTER — Other Ambulatory Visit: Payer: Self-pay

## 2023-04-29 ENCOUNTER — Emergency Department (HOSPITAL_BASED_OUTPATIENT_CLINIC_OR_DEPARTMENT_OTHER)

## 2023-04-29 ENCOUNTER — Emergency Department (HOSPITAL_BASED_OUTPATIENT_CLINIC_OR_DEPARTMENT_OTHER)
Admission: EM | Admit: 2023-04-29 | Discharge: 2023-04-29 | Disposition: A | Discharge status: Home / Self Care | Attending: Emergency Medicine | Admitting: Emergency Medicine

## 2023-04-29 DIAGNOSIS — R0789 Other chest pain: Secondary | ICD-10-CM | POA: Insufficient documentation

## 2023-04-29 DIAGNOSIS — E119 Type 2 diabetes mellitus without complications: Secondary | ICD-10-CM | POA: Diagnosis not present

## 2023-04-29 DIAGNOSIS — J45909 Unspecified asthma, uncomplicated: Secondary | ICD-10-CM | POA: Insufficient documentation

## 2023-04-29 DIAGNOSIS — F419 Anxiety disorder, unspecified: Secondary | ICD-10-CM

## 2023-04-29 HISTORY — DX: Type 2 diabetes mellitus without complications: E11.9

## 2023-04-29 HISTORY — DX: Anxiety disorder, unspecified: F41.9

## 2023-04-29 LAB — BASIC METABOLIC PANEL WITH GFR
Anion gap: 0 — ABNORMAL LOW (ref 5–15)
BUN: 10 mg/dL (ref 6–20)
CO2: 20 mmol/L — ABNORMAL LOW (ref 22–32)
Calcium: 9.3 mg/dL (ref 8.9–10.3)
Chloride: 102 mmol/L (ref 98–111)
Creatinine, Ser: 0.59 mg/dL (ref 0.44–1.00)
GFR, Estimated: >60 mL/min (ref >60)
Glucose, Bld: 133 mg/dL — ABNORMAL HIGH (ref 70–99)
Potassium: 4 mmol/L (ref 3.5–5.1)
Sodium: 137 mmol/L (ref 135–145)

## 2023-04-29 LAB — CBC
HCT: 38.1 % (ref 36.0–46.0)
Hemoglobin: 12.1 g/dL (ref 12.0–15.0)
MCH: 25.3 pg — ABNORMAL LOW (ref 26.0–34.0)
MCHC: 31.8 g/dL (ref 30.0–36.0)
MCV: 79.7 fL — ABNORMAL LOW (ref 80.0–100.0)
Platelets: 401 10*3/uL — ABNORMAL HIGH (ref 150–400)
RBC: 4.78 MIL/uL (ref 3.87–5.11)
RDW: 16.1 % — ABNORMAL HIGH (ref 11.5–15.5)
WBC: 8.3 10*3/uL (ref 4.0–10.5)
nRBC: 0 % (ref 0.0–0.2)

## 2023-04-29 LAB — D-DIMER, QUANTITATIVE: D-Dimer, Quant: 0.3 ug{FEU}/mL (ref 0.00–0.50)

## 2023-04-29 LAB — PREGNANCY, URINE: Preg Test, Ur: NEGATIVE

## 2023-04-29 LAB — TROPONIN T, HIGH SENSITIVITY: Troponin T High Sensitivity: <15 ng/L (ref <19)

## 2023-04-29 MED ORDER — KETOROLAC TROMETHAMINE 15 MG/ML IJ SOLN
15.0000 mg | Freq: Once | INTRAMUSCULAR | Status: AC
  Administered 2023-04-29: 15 mg via INTRAVENOUS
  Filled 2023-04-29: qty 1

## 2023-04-29 MED ORDER — KETOROLAC TROMETHAMINE 15 MG/ML IJ SOLN
15.0000 mg | Freq: Once | INTRAMUSCULAR | Status: DC
Start: 2023-04-29 — End: 2023-04-29

## 2023-04-29 NOTE — Discharge Instructions (Addendum)
 We evaluated you for your chest pain.  Your testing was reassuring.  We did not see any sign of a heart attack or blood clot.  Please take Tylenol (acetaminophen) and Motrin  (ibuprofen ) for your symptoms at home.  You can take 1000 mg of Tylenol every 6 hours and 600 mg of Motrin  every 6 hours as needed for your symptoms.  You can take these medicines together as needed, either at the same time, or alternating every 3 hours.

## 2023-04-29 NOTE — ED Provider Notes (Signed)
 [place] EMERGENCY DEPARTMENT AT [facility] Provider Note  CSN: [id number] Arrival date & time: 04/29/23 4098  Chief Complaint(s) Chest Pain  HPI Susan DITTMAN is a 28 y.o. female history of diabetes presenting to the emergency department with chest pain.  Patient reports chest pain starting last night after eating dinner.  Reports that it is in the mid to right chest.  No lightheadedness or dizziness, reports feeling "jittery".  She reports no associated shortness of breath, nausea, vomiting, fevers, chills, cough, runny nose, sore throat, syncope.  Pain is not pleuritic and is not exertional.  Triage note mentions patient has history of blood clot, patient reports that when she was 18 there was a "suspicion of a blood clot" but she was never diagnosed with a blood clot and was never started on anticoagulation.  She reports that she takes no oral contraceptives but uses a NuvaRing.  No recent travel or surgery.   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 Authorizing Provider  albuterol  (PROVENTIL  HFA;VENTOLIN  HFA) 108 (90 BASE) MCG/ACT inhaler Inhale 2 puffs into the lungs every 6 (six) hours as needed for shortness of breath. For shortness of breath    [provider]  famotidine  (PEPCID ) 20 MG tablet Take 1 tablet (20 mg total) by mouth daily. 11/18/17   Caccavale, Sophia, PA-C  ibuprofen  (ADVIL ,MOTRIN ) 800 MG tablet Take 800 mg by  mouth every 8 (eight) hours as needed for pain.    [provider]  topiramate  (TOPAMAX ) 25 MG tablet As previously prescribed 03/11/12   Burton, Jalan, MD  topiramate  (TOPAMAX ) 25 MG tablet Take 25 mg by mouth 2 (two) times daily.    [provider]                                                                                                                                    Past Surgical History History reviewed. No pertinent surgical history. Family History History reviewed. No pertinent family history.  Social History Social History   Tobacco Use   Smoking status: Never   Smokeless tobacco: Never  Vaping Use   Vaping status: Every Day  Substance Use Topics   Alcohol use: Yes    Comment: occ   Drug use: Never   Allergies Bee venom, Mushroom extract complex (obsolete), and Penicillins  Review of  Systems Review of Systems  All other systems reviewed and are negative.   Physical Exam Vital Signs  I have reviewed the triage vital signs BP (!) 147/90 (BP Location: Right Arm)   Pulse 74   Temp 98 F (36.7 C) (Oral)   Resp (!) 22   Ht 5\' 4"  (1.626 m)   Wt (!) 191.4 kg   SpO2 96%   BMI 72.44 kg/m  Physical Exam Vitals and nursing note reviewed.  Constitutional:      General: She is not in acute distress.    Appearance: She is well-developed. She is obese.  HENT:     Head: Normocephalic and atraumatic.     Mouth/Throat:     Mouth: Mucous membranes are moist.  Eyes:     Pupils: Pupils are equal, round, and reactive to light.  Cardiovascular:     Rate and Rhythm: Normal rate and regular rhythm.     Heart sounds: No murmur heard. Pulmonary:     Effort: Pulmonary effort is normal. No respiratory distress.     Breath sounds: Normal breath sounds.  Abdominal:     General: Abdomen is flat.     Palpations: Abdomen is soft.     Tenderness: There is no abdominal tenderness.  Musculoskeletal:        General: No tenderness.     Right lower leg: No  edema.     Left lower leg: No edema.  Skin:    General: Skin is warm and dry.  Neurological:     General: No focal deficit present.     Mental Status: She is alert. Mental status is at baseline.  Psychiatric:        Mood and Affect: Mood normal.        Behavior: Behavior normal.     ED Results and Treatments Labs (all labs ordered are listed, but only abnormal results are displayed) Labs Reviewed  BASIC METABOLIC PANEL WITH GFR - Abnormal; Notable for the following components:      Result Value   CO2 35 (*)    Glucose, Bld 133 (*)    Anion gap 0 (*)    All other components within normal limits  CBC - Abnormal; Notable for the following components:   MCV 79.7 (*)    MCH 25.3 (*)    RDW 16.1 (*)    Platelets 401 (*)    All other components within normal limits  PREGNANCY, URINE  D-DIMER, QUANTITATIVE  TROPONIN T, HIGH SENSITIVITY                                                                                                                          Radiology DG Chest 2 View Result Date: 04/29/2023 CLINICAL DATA:  Chest pain EXAM: CHEST - 2 VIEW COMPARISON:  None Available. FINDINGS: The heart size and mediastinal contours are within normal limits. Both lungs are clear. The visualized skeletal structures are unremarkable. IMPRESSION: No active cardiopulmonary disease. Electronically Signed  By: Fredrich Jefferson M.D.   On: 04/29/2023 11:59    Pertinent labs & imaging results that were available during my care of the patient were reviewed by me and considered in my medical decision making (see MDM for details).  Medications Ordered in ED Medications  ketorolac  (TORADOL ) 15 MG/ML injection 15 mg (15 mg Intravenous Given 04/29/23 1026)                                                                                                                                     Procedures Procedures  (including critical care time)  Medical Decision Making / ED  Course   MDM:  28 year old presenting with chest pain  Physical examination is reassuring.  Patient has mild tachycardia on arrival.  Currently heart rate is 85.  Unclear cause of symptoms, considered pulmonary embolism given purported PE history however on further history patient denies history of blood clot, not on oral contraceptive.  Given tachycardia, D-dimer was sent and negative, very low concern for PE.  Also considered ACS, patient very young, EKG without concerning changes, will check troponin x 1.  Differential also includes thoracic process such as pneumonia, pneumothorax, will check chest x-ray.  Doubt esophageal perforation with no nausea, vomiting.  If workup is reassuring, anticipate discharge. Clinical Course as of 04/29/23 1208  Tue Apr 29, 2023  1207 Workup reassuring.  X-ray negative.  Suspect musculoskeletal pain.  Patient also endorses significant anxiety which may be playing a role in her symptoms. Will discharge patient to home. All questions answered. Patient comfortable with plan of discharge. Return precautions discussed with patient and specified on the after visit summary.  [WS]    Clinical Course User Index [WS] Isaiah Marc, Dozier Genre, MD     Additional history obtained: -Additional history obtained from family -External records from outside source obtained and reviewed including: Chart review including previous notes, labs, imaging, consultation notes including prior notes    Lab Tests: -I ordered, reviewed, and interpreted labs.   The pertinent results include:   Labs Reviewed  BASIC METABOLIC PANEL WITH GFR - Abnormal; Notable for the following components:      Result Value   CO2 35 (*)    Glucose, Bld 133 (*)    Anion gap 0 (*)    All other components within normal limits  CBC - Abnormal; Notable for the following components:   MCV 79.7 (*)    MCH 25.3 (*)    RDW 16.1 (*)    Platelets 401 (*)    All other components within normal limits   PREGNANCY, URINE  D-DIMER, QUANTITATIVE  TROPONIN T, HIGH SENSITIVITY    Notable for false elevation in CO2 and low anion gap (laboratory machine is providing these numbers for every ER patient today). Normal troponin and d-dimer   EKG   EKG Interpretation Date/Time:  Tuesday April 29 2023 09:23:15 EDT Ventricular Rate:  113 PR  Interval:  152 QRS Duration:  104 QT Interval:  347 QTC Calculation: 476 R Axis:   52  Text Interpretation: Sinus tachycardia Borderline prolonged QT interval Confirmed by Hiawatha Lout ([id number]) on 04/29/2023 10:06:54 AM         Imaging Studies ordered: I ordered imaging studies including CXR On my interpretation imaging demonstrates no acute process I independently visualized and interpreted imaging. I agree with the radiologist interpretation   Medicines ordered and prescription drug management: Meds ordered this encounter  Medications   DISCONTD: ketorolac  (TORADOL ) 15 MG/ML injection 15 mg   ketorolac  (TORADOL ) 15 MG/ML injection 15 mg    -I have reviewed the patients home medicines and have made adjustments as needed  Social Determinants of Health:  Diagnosis or treatment significantly limited by social determinants of health: obesity   Reevaluation: After the interventions noted above, I reevaluated the patient and found that their symptoms have improved  Co morbidities that complicate the patient evaluation  Past Medical History:  Diagnosis Date   Anxiety    Asthma    Diabetes mellitus without complication (HCC)    Migraine    Pulmonary emboli (HCC)       Dispostion: Disposition decision including need for hospitalization was considered, and patient discharged from emergency department.    Final Clinical Impression(s) / ED Diagnoses Final diagnoses:  Atypical chest pain     This chart was dictated using voice recognition software.  Despite best efforts to proofread,  errors can occur which can change the  documentation meaning.    Mordecai Applebaum, MD 04/29/23 [phone]

## 2023-04-29 NOTE — ED Notes (Signed)
 Patient transported to X-ray

## 2023-04-29 NOTE — ED Triage Notes (Signed)
 Pt states that last night after eating dinner her chest felt "weird." Like her "heart was slowing down and speeding up." Now c/o weakness and dizziness. Thought it could have been acid reflux, took Tums this am without relief. Pt has hx of blood clots and takes bc.   Anastacio Balm, RN
